# Patient Record
Sex: Male | Born: 1990 | Race: White | Hispanic: No | Marital: Married
Health system: Midwestern US, Community
[De-identification: ages and names within clinical notes are randomized; demographics above are authoritative.]

---
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
## 2015-06-03 ENCOUNTER — Ambulatory Visit (INDEPENDENT_AMBULATORY_CARE_PROVIDER_SITE_OTHER): Payer: Federal, State, Local not specified - PPO | Admitting: Family Medicine

## 2015-06-03 VITALS — BP 120/82 | HR 74 | Temp 98.1°F | Resp 16 | Ht 68.25 in | Wt 214.0 lb

## 2015-06-03 DIAGNOSIS — L600 Ingrowing nail: Secondary | ICD-10-CM | POA: Diagnosis not present

## 2015-06-03 DIAGNOSIS — L03032 Cellulitis of left toe: Secondary | ICD-10-CM

## 2015-06-03 MED ORDER — CEPHALEXIN 500 MG PO CAPS: 500.0000 mg | ORAL_CAPSULE | Freq: Two times a day (BID) | ORAL | Status: AC

## 2015-06-03 NOTE — Progress Notes (Signed)
Subjective:  This chart was scribed for Tanner Post, MD by Broadus John, Medical Scribe. This patient was seen in Room 4 and the patient's care was started at 8:47 AM.   Patient ID: Tanner Castillo, male    DOB: 28-Jan-1991, 24 y.o.   MRN: [id number]  Chief Complaint  Patient presents with  . Ingrown Toenail    Left great toe/ onset 1.5 months   HPI HPI Comments: Tanner Castillo is a 24 y.o. male who presents to [facility] complaining of an ingrown toenail of the left great toe, onset 1.5 months ago.  Pt notes that the nail was slowly growing to the side. He indicates that about a month ago he received antibiotics for the symptoms, however the symptoms came back after about 2 days of finishing the course of the antibiotics. Pt reports that the area is very sore and has drainage. Pt denies any allergies.    There are no active problems to display for this patient.  History reviewed. No pertinent past medical history. History reviewed. No pertinent past surgical history. No Known Allergies Prior to Admission medications   Not on File   Social History   Social History  . Marital Status: Single    Spouse Name: N/A  . Number of Children: N/A  . Years of Education: N/A   Occupational History  . Not on file.   Social History Main Topics  . Smoking status: Never Smoker   . Smokeless tobacco: Not on file  . Alcohol Use: 1.2 oz/week    2 Standard drinks or equivalent per week  . Drug Use: No  . Sexual Activity: Not on file   Other Topics Concern  . Not on file   Social History Narrative  . No narrative on file    Review of Systems  Skin: Positive for wound.      Objective:   Physical Exam  Constitutional: He is oriented to person, place, and time. He appears well-developed and well-nourished. No distress.  HENT:  Head: Normocephalic and atraumatic.  Eyes: EOM are normal. Pupils are equal, round, and reactive to light.  Neck: Neck supple.    Cardiovascular: Normal rate.   Pulmonary/Chest: Effort normal.  Neurological: He is alert and oriented to person, place, and time. No cranial nerve deficit.  Skin: Skin is warm and dry. There is erythema.  Left great toe there is some redundant tissue with clear-yellow discharge on the media and lateral nail fold, surrounding erythema. Erythema on the proximal nail folds.  NVI distally.    Psychiatric: He has a normal mood and affect. His behavior is normal.  Nursing note and vitals reviewed.   Filed Vitals:   06/03/15 0833  BP: 120/82  Pulse: 74  Temp: 98.1 F (36.7 C)  TempSrc: Oral  Resp: 16  Height: 5' 8.25" (1.734 m)  Weight: 214 lb (97.07 kg)  SpO2: 98%      Assessment & Plan:   Tanner Castillo is a 24 y.o. male Ingrown toenail - Plan: cephALEXin (KEFLEX) 500 MG capsule  Paronychia of great toe of left foot - Plan: cephALEXin (KEFLEX) 500 MG capsule  Bilateral ingrown nail with secondary infection/paronychia. See procedure note. Start Keflex 500 mg twice a day. Wedge excision as per procedure note, wound care and return to clinic precautions, as well as information on AVS.   Meds ordered this encounter  Medications  . cephALEXin (KEFLEX) 500 MG capsule    Sig:  Take 1 capsule (500 mg total) by mouth 2 (two) times daily.    Dispense:  20 capsule    Refill:  0   Patient Instructions  Nail care as discussed with Sarah, start antibiotic twice per day, advil or alleve over the counter if needed.  Return to the clinic or go to the nearest emergency room if any of your symptoms worsen or new symptoms occur.  Infected Ingrown Toenail An infected ingrown toenail occurs when the nail edge grows into the skin and bacteria invade the area. Symptoms include pain, tenderness, swelling, and pus drainage from the edge of the nail. Poorly fitting shoes, minor injuries, and improper cutting of the toenail may also contribute to the problem. You should cut your toenails squarely  instead of rounding the edges. Do not cut them too short. Avoid tight or pointed toe shoes. Sometimes the ingrown portion of the nail must be removed. If your toenail is removed, it can take 3-4 months for it to re-grow. HOME CARE INSTRUCTIONS   Soak your infected toe in warm water for 20-30 minutes, 2 to 3 times a day.  Packing or dressings applied to the area should be changed daily.  Take medicine as directed and finish them.  Reduce activities and keep your foot elevated when able to reduce swelling and discomfort. Do this until the infection gets better.  Wear sandals or go barefoot as much as possible while the infected area is sensitive.  See your caregiver for follow-up care in 2-3 days if the infection is not better. SEEK MEDICAL CARE IF:  Your toe is becoming more red, swollen or painful. MAKE SURE YOU:   Understand these instructions.  Will watch your condition.  Will get help right away if you are not doing well or get worse. Document Released: 10/19/2004 Document Revised: 12/04/2011 Document Reviewed: 09/07/2008 [facility] Patient Information 2015 [place], [place]. This information is not intended to replace advice given to you by your health care provider. Make sure you discuss any questions you have with your health care provider.     I personally performed the services described in this documentation, which was scribed in my presence. The recorded information has been reviewed and considered, and addended by me as needed.

## 2015-06-03 NOTE — Patient Instructions (Addendum)
Nail care as discussed with Maralyn Sago, start antibiotic twice per day, advil or alleve over the counter if needed.  Return to the clinic or go to the nearest emergency room if any of your symptoms worsen or new symptoms occur.  Infected Ingrown Toenail An infected ingrown toenail occurs when the nail edge grows into the skin and bacteria invade the area. Symptoms include pain, tenderness, swelling, and pus drainage from the edge of the nail. Poorly fitting shoes, minor injuries, and improper cutting of the toenail may also contribute to the problem. You should cut your toenails squarely instead of rounding the edges. Do not cut them too short. Avoid tight or pointed toe shoes. Sometimes the ingrown portion of the nail must be removed. If your toenail is removed, it can take 3-4 months for it to re-grow. HOME CARE INSTRUCTIONS   Soak your infected toe in warm water for 20-30 minutes, 2 to 3 times a day.  Packing or dressings applied to the area should be changed daily.  Take medicine as directed and finish them.  Reduce activities and keep your foot elevated when able to reduce swelling and discomfort. Do this until the infection gets better.  Wear sandals or go barefoot as much as possible while the infected area is sensitive.  See your caregiver for follow-up care in 2-3 days if the infection is not better. SEEK MEDICAL CARE IF:  Your toe is becoming more red, swollen or painful. MAKE SURE YOU:   Understand these instructions.  Will watch your condition.  Will get help right away if you are not doing well or get worse. Document Released: 10/19/2004 Document Revised: 12/04/2011 Document Reviewed: 09/07/2008 [facility] Patient Information 2015 [place], [place]. This information is not intended to replace advice given to you by your health care provider. Make sure you discuss any questions you have with your health care provider.

## 2015-06-03 NOTE — Progress Notes (Signed)
Procedure:  Consent obtained. Digital block with marcaine and 2% lidocaine for adequate anesthesia.  Betadine prep.  Medial and lateral wedge excisions performed.  Granulation tissue removed and AgNO3 used for cautery.  Xeroform gauze placed on exposed nail bed.  Dressing placed.  Wound care d/w pt.

## 2015-10-06 ENCOUNTER — Ambulatory Visit (INDEPENDENT_AMBULATORY_CARE_PROVIDER_SITE_OTHER): Payer: Federal, State, Local not specified - PPO | Admitting: Emergency Medicine

## 2015-10-06 VITALS — BP 118/82 | HR 83 | Temp 98.6°F | Resp 16 | Ht 69.0 in | Wt 200.0 lb

## 2015-10-06 DIAGNOSIS — L600 Ingrowing nail: Secondary | ICD-10-CM

## 2015-10-06 DIAGNOSIS — L03012 Cellulitis of left finger: Secondary | ICD-10-CM | POA: Diagnosis not present

## 2015-10-06 MED ORDER — SULFAMETHOXAZOLE-TRIMETHOPRIM 800-160 MG PO TABS: 1.0000 | ORAL_TABLET | Freq: Two times a day (BID) | ORAL | Status: AC

## 2015-10-06 NOTE — Patient Instructions (Signed)
Ingrown Toenail  An ingrown toenail occurs when the corner or sides of your toenail grow into the surrounding skin. The big toe is most commonly affected, but it can happen to any of your toes. If your ingrown toenail is not treated, you will be at risk for infection.  CAUSES  This condition may be caused by:  · Wearing shoes that are too small or tight.  · Injury or trauma, such as stubbing your toe or having your toe stepped on.  · Improper cutting or care of your toenails.  · Being born with (congenital) nail or foot abnormalities, such as having a nail that is too big for your toe.  RISK FACTORS  Risk factors for an ingrown toenail include:  · Age. Your nails tend to thicken as you get older, so ingrown nails are more common in older people.  · Diabetes.  · Cutting your toenails incorrectly.  · Blood circulation problems.  SYMPTOMS  Symptoms may include:  · Pain, soreness, or tenderness.  · Redness.  · Swelling.  · Hardening of the skin surrounding the toe.  Your ingrown toenail may be infected if there is fluid, pus, or drainage.  DIAGNOSIS   An ingrown toenail may be diagnosed by medical history and physical exam. If your toenail is infected, your health care provider may test a sample of the drainage.  TREATMENT  Treatment depends on the severity of your ingrown toenail. Some ingrown toenails may be treated at home. More severe or infected ingrown toenails may require surgery to remove all or part of the nail. Infected ingrown toenails may also be treated with antibiotic medicines.  HOME CARE INSTRUCTIONS  · If you were prescribed an antibiotic medicine, finish all of it even if you start to feel better.  · Soak your foot in warm soapy water for 20 minutes, 3 times per day or as directed by your health care provider.  · Carefully lift the edge of the nail away from the sore skin by wedging a small piece of cotton under the corner of the nail. This may help with the pain.  Be careful not to cause more injury  to the area.  · Wear shoes that fit well. If your ingrown toenail is causing you pain, try wearing sandals, if possible.  · Trim your toenails regularly and carefully. Do not cut them in a curved shape. Cut your toenails straight across. This prevents injury to the skin at the corners of the toenail.  · Keep your feet clean and dry.  · If you are having trouble walking and are given crutches by your health care provider, use them as directed.  · Do not pick at your toenail or try to remove it yourself.  · Take medicines only as directed by your health care provider.  · Keep all follow-up visits as directed by your health care provider. This is important.  SEEK MEDICAL CARE IF:  · Your symptoms do not improve with treatment.  SEEK IMMEDIATE MEDICAL CARE IF:  · You have red streaks that start at your foot and go up your leg.  · You have a fever.  · You have increased redness, swelling, or pain.  · You have fluid, blood, or pus coming from your toenail.     This information is not intended to replace advice given to you by your health care provider. Make sure you discuss any questions you have with your health care provider.     Document Released:   09/08/2000 Document Revised: 01/26/2015 Document Reviewed: 08/05/2014  Elsevier Interactive Patient Education ©2016 [employer] Inc[employer]

## 2015-10-06 NOTE — Progress Notes (Signed)
Subjective:  Patient ID: Tanner Castillo, male    DOB: 04/13/1991  Age: 25 y.o. MRN: [id number]  CC: Nail Problem   HPI Tanner Angelndrew S Larmore presents   With a recurrent ingrown nail. He has swelling and redness around the base of the nail and sides.  It was previously treated surgically by removal of the entire nail plate  History Tanner Castillo has no past medical history on file.   He has no past surgical history on file.   His  family history includes Cancer in his maternal grandmother.  He   reports that he has never smoked. He does not have any smokeless tobacco history on file. He reports that he drinks about 1.2 oz of alcohol per week. He reports that he does not use illicit drugs.  Outpatient Prescriptions Prior to Visit  Medication Sig Dispense Refill  . cephALEXin (KEFLEX) 500 MG capsule Take 1 capsule (500 mg total) by mouth 2 (two) times daily. (Patient not taking: Reported on 10/06/2015) 20 capsule 0   No facility-administered medications prior to visit.    Social History   Social History  . Marital Status: Single    Spouse Name: N/A  . Number of Children: N/A  . Years of Education: N/A   Social History Main Topics  . Smoking status: Never Smoker   . Smokeless tobacco: None  . Alcohol Use: 1.2 oz/week    2 Standard drinks or equivalent per week  . Drug Use: No  . Sexual Activity: Not Asked   Other Topics Concern  . None   Social History Narrative     Review of Systems  Constitutional: Negative for fever, chills and appetite change.  HENT: Negative for congestion, ear pain, postnasal drip, sinus pressure and sore throat.   Eyes: Negative for pain and redness.  Respiratory: Negative for cough, shortness of breath and wheezing.   Cardiovascular: Negative for leg swelling.  Gastrointestinal: Negative for nausea, vomiting, abdominal pain, diarrhea, constipation and blood in stool.  Endocrine: Negative for polyuria.  Genitourinary: Negative for dysuria, urgency,  frequency and flank pain.  Musculoskeletal: Negative for gait problem.  Skin: Negative for rash.  Neurological: Negative for weakness and headaches.  Psychiatric/Behavioral: Negative for confusion and decreased concentration. The patient is not nervous/anxious.     Objective:  BP 118/82 mmHg  Pulse 83  Temp(Src) 98.6 F (37 C)  Resp 16  Ht 5\' 9"  (1.753 m)  Wt 200 lb (90.719 kg)  BMI 29.52 kg/m2  SpO2 98%  Physical Exam  Constitutional: He is oriented to person, place, and time. He appears well-developed and well-nourished.  HENT:  Head: Normocephalic and atraumatic.  Eyes: Conjunctivae are normal. Pupils are equal, round, and reactive to light.  Pulmonary/Chest: Effort normal.  Musculoskeletal: He exhibits no edema.  Neurological: He is alert and oriented to person, place, and time.  Skin: Skin is dry.  Psychiatric: He has a normal mood and affect. His behavior is normal. Thought content normal.    he is an ingrown left great toenail in the lateral  nailfold.  He has a paronychia. He has no lymphangitis   Assessment & Plan:   Tanner Castillo was seen today for nail problem.  Diagnoses and all orders for this visit:  Ingrown nail -     Ambulatory referral to Podiatry  Paronychia, left  Other orders -     sulfamethoxazole-trimethoprim (BACTRIM DS,SEPTRA DS) 800-160 MG tablet; Take 1 tablet by mouth 2 (two) times daily.   I am  having Mr. Mierzwa start on sulfamethoxazole-trimethoprim. I am also having him maintain his cephALEXin.  Meds ordered this encounter  Medications  . sulfamethoxazole-trimethoprim (BACTRIM DS,SEPTRA DS) 800-160 MG tablet    Sig: Take 1 tablet by mouth 2 (two) times daily.    Dispense:  20 tablet    Refill:  0    Appropriate red flag conditions were discussed with the patient as well as actions that should be taken.  Patient expressed his understanding.  Follow-up: Return if symptoms worsen or fail to improve.  Carmelina Dane, MD

## 2018-06-08 ENCOUNTER — Emergency Department: Admit: 2018-06-08 | Payer: TRICARE (CHAMPUS)

## 2018-06-08 ENCOUNTER — Inpatient Hospital Stay
Admit: 2018-06-08 | Discharge: 2018-06-08 | Disposition: A | Discharge status: Home / Self Care | Payer: TRICARE (CHAMPUS) | Attending: Emergency Medicine

## 2018-06-08 DIAGNOSIS — J020 Streptococcal pharyngitis: Secondary | ICD-10-CM

## 2018-06-08 LAB — CBC WITH AUTOMATED DIFF
ABS. BASOPHILS: 0 10*3/uL (ref 0.0–0.1)
ABS. EOSINOPHILS: 0 10*3/uL (ref 0.0–0.4)
ABS. LYMPHOCYTES: 0.7 10*3/uL — ABNORMAL LOW (ref 0.9–3.6)
ABS. MONOCYTES: 1.1 10*3/uL (ref 0.05–1.2)
ABS. NEUTROPHILS: 8.5 10*3/uL — ABNORMAL HIGH (ref 1.8–8.0)
BASOPHILS: 0 % (ref 0–2)
EOSINOPHILS: 0 % (ref 0–5)
HCT: 42.7 % (ref 36.0–48.0)
HGB: 14.8 g/dL (ref 13.0–16.0)
LYMPHOCYTES: 7 % — ABNORMAL LOW (ref 21–52)
MCH: 30.6 PG (ref 24.0–34.0)
MCHC: 34.7 g/dL (ref 31.0–37.0)
MCV: 88.4 FL (ref 74.0–97.0)
MONOCYTES: 11 % — ABNORMAL HIGH (ref 3–10)
MPV: 11.1 FL (ref 9.2–11.8)
NEUTROPHILS: 82 % — ABNORMAL HIGH (ref 40–73)
PLATELET: 235 10*3/uL (ref 135–420)
RBC: 4.83 M/uL (ref 4.70–5.50)
RDW: 12.4 % (ref 11.6–14.5)
WBC: 10.4 10*3/uL (ref 4.6–13.2)

## 2018-06-08 LAB — URINE MICROSCOPIC ONLY
WBC, UA: 11 /hpf (ref 0–4)
WBC: 11 /hpf (ref 0–4)

## 2018-06-08 LAB — URINALYSIS W/ RFLX MICROSCOPIC
Bilirubin, Urine: NEGATIVE
Bilirubin: NEGATIVE
Blood, Urine: NEGATIVE
Blood: NEGATIVE
Glucose, Ur: NEGATIVE mg/dL
Glucose: NEGATIVE mg/dL
Ketone: 80 mg/dL — AB
Ketones, Urine: 80 mg/dL — AB
Nitrite, Urine: NEGATIVE
Nitrites: NEGATIVE
Protein, UA: 30 mg/dL — AB
Protein: 30 mg/dL — AB
Specific Gravity, UA: 1.029 (ref 1.005–1.030)
Specific gravity: 1.029 (ref 1.005–1.030)
Urobilinogen, UA, POCT: 0.2 EU/dL (ref 0.2–1.0)
Urobilinogen: 0.2 EU/dL (ref 0.2–1.0)
pH (UA): 5.5 (ref 5.0–8.0)
pH, UA: 5.5 (ref 5.0–8.0)

## 2018-06-08 LAB — METABOLIC PANEL, COMPREHENSIVE
A-G Ratio: 1 (ref 0.8–1.7)
ALT (SGPT): 29 U/L (ref 16–61)
AST (SGOT): 25 U/L (ref 10–38)
Albumin: 4.1 g/dL (ref 3.4–5.0)
Alk. phosphatase: 54 U/L (ref 45–117)
Anion gap: 10 mmol/L (ref 3.0–18)
BUN/Creatinine ratio: 12 (ref 12–20)
BUN: 18 MG/DL (ref 7.0–18)
Bilirubin, total: 1.1 MG/DL — ABNORMAL HIGH (ref 0.2–1.0)
CO2: 25 mmol/L (ref 21–32)
Calcium: 9.7 MG/DL (ref 8.5–10.1)
Chloride: 99 mmol/L — ABNORMAL LOW (ref 100–111)
Creatinine: 1.47 MG/DL — ABNORMAL HIGH (ref 0.6–1.3)
GFR est AA: >60 mL/min/{1.73_m2} (ref >60)
GFR est non-AA: 58 mL/min/{1.73_m2} — ABNORMAL LOW (ref >60)
Globulin: 4.3 g/dL — ABNORMAL HIGH (ref 2.0–4.0)
Glucose: 89 mg/dL (ref 74–99)
Potassium: 3.6 mmol/L (ref 3.5–5.5)
Protein, total: 8.4 g/dL — ABNORMAL HIGH (ref 6.4–8.2)
Sodium: 134 mmol/L — ABNORMAL LOW (ref 136–145)

## 2018-06-08 LAB — LIPASE
Lipase: 166 U/L (ref 73–393)
Lipase: 166 U/L (ref 73–393)

## 2018-06-08 LAB — COMPREHENSIVE METABOLIC PANEL
ALT: 29 U/L (ref 16–61)
AST: 25 U/L (ref 10–38)
Albumin/Globulin Ratio: 1 (ref 0.8–1.7)
Albumin: 4.1 g/dL (ref 3.4–5.0)
Alkaline Phosphatase: 54 U/L (ref 45–117)
Anion Gap: 10 mmol/L (ref 3.0–18)
BUN: 18 MG/DL (ref 7.0–18)
Bun/Cre Ratio: 12 (ref 12–20)
CO2: 25 mmol/L (ref 21–32)
Calcium: 9.7 MG/DL (ref 8.5–10.1)
Chloride: 99 mmol/L — ABNORMAL LOW (ref 100–111)
Creatinine: 1.47 MG/DL — ABNORMAL HIGH (ref 0.6–1.3)
EGFR IF NonAfrican American: 58 mL/min/{1.73_m2} — ABNORMAL LOW (ref >60)
GFR African American: >60 mL/min/{1.73_m2} (ref >60)
Globulin: 4.3 g/dL — ABNORMAL HIGH (ref 2.0–4.0)
Glucose: 89 mg/dL (ref 74–99)
Potassium: 3.6 mmol/L (ref 3.5–5.5)
Sodium: 134 mmol/L — ABNORMAL LOW (ref 136–145)
Total Bilirubin: 1.1 MG/DL — ABNORMAL HIGH (ref 0.2–1.0)
Total Protein: 8.4 g/dL — ABNORMAL HIGH (ref 6.4–8.2)

## 2018-06-08 LAB — CBC WITH AUTO DIFFERENTIAL
Basophils %: 0 % (ref 0–2)
Basophils Absolute: 0 10*3/uL (ref 0.0–0.1)
Eosinophils %: 0 % (ref 0–5)
Eosinophils Absolute: 0 10*3/uL (ref 0.0–0.4)
Hematocrit: 42.7 % (ref 36.0–48.0)
Hemoglobin: 14.8 g/dL (ref 13.0–16.0)
Lymphocytes %: 7 % — ABNORMAL LOW (ref 21–52)
Lymphocytes Absolute: 0.7 10*3/uL — ABNORMAL LOW (ref 0.9–3.6)
MCH: 30.6 PG (ref 24.0–34.0)
MCHC: 34.7 g/dL (ref 31.0–37.0)
MCV: 88.4 FL (ref 74.0–97.0)
MPV: 11.1 FL (ref 9.2–11.8)
Monocytes %: 11 % — ABNORMAL HIGH (ref 3–10)
Monocytes Absolute: 1.1 10*3/uL (ref 0.05–1.2)
Neutrophils %: 82 % — ABNORMAL HIGH (ref 40–73)
Neutrophils Absolute: 8.5 10*3/uL — ABNORMAL HIGH (ref 1.8–8.0)
Platelets: 235 10*3/uL (ref 135–420)
RBC: 4.83 M/uL (ref 4.70–5.50)
RDW: 12.4 % (ref 11.6–14.5)
WBC: 10.4 10*3/uL (ref 4.6–13.2)

## 2018-06-08 MED ORDER — AMOXICILLIN 500 MG TABLET
500 mg | ORAL_TABLET | Freq: Three times a day (TID) | ORAL | 0 refills | Status: AC
Start: 2018-06-08 — End: 2018-06-15

## 2018-06-08 MED ORDER — SUCRALFATE 100 MG/ML ORAL SUSP
100 mg/mL | Freq: Four times a day (QID) | ORAL | 0 refills | Status: AC
Start: 2018-06-08 — End: ?

## 2018-06-08 MED ORDER — ONDANSETRON (PF) 4 MG/2 ML INJECTION
4 mg/2 mL | INTRAMUSCULAR | Status: AC
Start: 2018-06-08 — End: 2018-06-08
  Administered 2018-06-08: 18:00:00 via INTRAVENOUS

## 2018-06-08 MED ORDER — ONDANSETRON 4 MG TAB, RAPID DISSOLVE
4 mg | ORAL_TABLET | ORAL | 0 refills | Status: AC
Start: 2018-06-08 — End: ?

## 2018-06-08 MED ORDER — SODIUM CHLORIDE 0.9% BOLUS IV
0.9 % | Freq: Once | INTRAVENOUS | Status: AC
Start: 2018-06-08 — End: 2018-06-08
  Administered 2018-06-08: 18:00:00 via INTRAVENOUS

## 2018-06-08 MED ORDER — ACETAMINOPHEN 325 MG TABLET
325 mg | ORAL | Status: AC
Start: 2018-06-08 — End: 2018-06-08
  Administered 2018-06-08: 19:00:00 via ORAL

## 2018-06-08 MED ORDER — IOPAMIDOL 61 % IV SOLN
300 mg iodine /mL (61 %) | Freq: Once | INTRAVENOUS | Status: AC
Start: 2018-06-08 — End: 2018-06-08
  Administered 2018-06-08: 20:00:00 via INTRAVENOUS

## 2018-06-08 MED FILL — ISOVUE-300  61 % INTRAVENOUS SOLUTION: 300 mg iodine /mL (61 %) | INTRAVENOUS | Qty: 100

## 2018-06-08 MED FILL — ONDANSETRON (PF) 4 MG/2 ML INJECTION: 4 mg/2 mL | INTRAMUSCULAR | Qty: 2

## 2018-06-08 MED FILL — SODIUM CHLORIDE 0.9 % IV: INTRAVENOUS | Qty: 1000

## 2018-06-08 MED FILL — ACETAMINOPHEN 325 MG TABLET: 325 mg | ORAL | Qty: 2

## 2018-06-08 NOTE — ED Triage Notes (Signed)
Seen last 3 days [place] [employer] [place] [employer]. Sore throat . Positive for strep. Med w/ bicillin 3 days ago / lower right abdominal pain. Vomiting diarrhea. Fever. Taking tylenol and motrin alternating

## 2018-06-08 NOTE — ED Provider Notes (Signed)
Patient presents complaining of fever sore throat and chest discomfort in addition to nausea vomiting and diarrhea.  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  His medical officer told him that if he got worse he should go to the ER which he has done.  He complains of burning discomfort in his chest and also mentioned to the triage nurse that he had pain in his right lower quadrant.           History reviewed. No pertinent past medical history.    History reviewed. No pertinent surgical history.      History reviewed. No pertinent family history.    Social History     Socioeconomic History   ??? Marital status: MARRIED     Spouse name: Not on file   ??? Number of children: Not on file   ??? Years of education: Not on file   ??? Highest education level: Not on file   Occupational History   ??? Not on file   Social Needs   ??? Financial resource strain: Not on file   ??? Food insecurity:     Worry: Not on file     Inability: Not on file   ??? Transportation needs:     Medical: Not on file     Non-medical: Not on file   Tobacco Use   ??? Smoking status: Never Smoker   ??? Smokeless tobacco: Current User   Substance and Sexual Activity   ??? Alcohol use: Not on file   ??? Drug use: Not on file   ??? Sexual activity: Not on file   Lifestyle   ??? Physical activity:     Days per week: Not on file     Minutes per session: Not on file   ??? Stress: Not on file   Relationships   ??? Social connections:     Talks on phone: Not on file     Gets together: Not on file     Attends religious service: Not on file     Active member of club or organization: Not on file     Attends meetings of clubs or organizations: Not on file     Relationship status: Not on file   ??? Intimate partner violence:     Fear of current or ex partner: Not on file     Emotionally abused: Not on file     Physically abused: Not on file     Forced sexual activity: Not on file    Other Topics Concern   ??? Not on file   Social History Narrative   ??? Not on file         ALLERGIES: Patient has no known allergies.    Review of Systems   Constitutional: Positive for chills and fever.   HENT: Positive for congestion.    Eyes: Negative.    Respiratory: Positive for cough.    Gastrointestinal: Positive for abdominal pain, diarrhea and nausea.   All other systems reviewed and are negative.      Vitals:    06/08/18 1233   BP: 119/83   Pulse: 100   Resp: 18   Temp: (!) 102.6 ??F (39.2 ??C)   SpO2: 99%   Weight: 94.3 kg (208 lb)   Height: 5' 8" (1.727 m)            Physical   Exam   Constitutional: He is oriented to person, place, and time. He appears well-developed and well-nourished.  Non-toxic appearance. He does not appear ill. No distress.   HENT:   Head: Normocephalic and atraumatic.   Right Ear: No drainage.   Left Ear: No drainage.   Mouth/Throat: Uvula is midline and mucous membranes are normal. Posterior oropharyngeal erythema present. No oropharyngeal exudate or tonsillar abscesses. No tonsillar exudate.   Eyes: Pupils are equal, round, and reactive to light.   Neck: Normal range of motion. Neck supple.   Cardiovascular: Regular rhythm.   Pulmonary/Chest: Effort normal and breath sounds normal.   Abdominal: Soft. There is tenderness.   Neurological: He is alert and oriented to person, place, and time.   Skin: Skin is warm and dry.   Psychiatric: His behavior is normal.        MDM  Number of Diagnoses or Management Options  Diagnosis management comments: Patient with previously treated strep here for persistent symptoms in addition to several other complaints, none of which have resulted in any acute diagnoses.  Will continue supportive care released to follow-up with his medical officer.       Amount and/or Complexity of Data Reviewed  Clinical lab tests: reviewed           Procedures

## 2018-06-08 NOTE — ED Notes (Cosign Needed)
I performed a brief history of the patient here in triage and I have determined that pt will need further treatment and evaluation from the main side ER physician.  I have placed initial orders based on the history to help in expediting patients care.       Pt treated for strep with bicillin on the ship.  3 days of vomiting, fever, intermittent RLQ pain.  States he did have some tenderness when the [employer] Doctor pressed on his RLQ.     Visit Vitals  BP 119/83 (BP 1 Location: Right arm, BP Patient Position: At rest)   Pulse 100   Temp (!) 102.6 ??F (39.2 ??C)   Resp 18   Ht 5\' 8"  (1.727 m)   Wt 94.3 kg (208 lb)   SpO2 99%   BMI 31.63 kg/m??      Nicolette BangAshlee L Miliyah Luper, PA

## 2018-06-08 NOTE — ED Notes (Signed)
Patient feeling better with decreased pain to throat. Written discharge instructions given to patient with 1 prescription. Discharge home ambulatory with male visitor.

## 2018-06-08 NOTE — ED Notes (Deleted)
I performed a brief history of the patient here in triage and I have determined that pt will need further treatment and evaluation from the main side ER physician.  I have placed initial orders based on the history to help in expediting patients care.       Pt treated for strep with bicillin on the ship.  3 days of vomiting, fever, intermittent RLQ pain.  States he did have some tenderness when the [employer] Doctor pressed on his RLQ.     Visit Vitals  BP 119/83 (BP 1 Location: Right arm, BP Patient Position: At rest)   Pulse 100   Temp (!) 102.6 F (39.2 C)   Resp 18   Ht 5\' 8"  (1.727 m)   Wt 94.3 kg (208 lb)   SpO2 99%   BMI 31.63 kg/m      Nicolette BangAshlee L Emmalynne Courtney, PA

## 2018-06-08 NOTE — ED Notes (Signed)
Seen last 3 days at [employer] [facility]. Sore throat . Positive for strep. Med w/ bicillin 3 days ago / lower right abdominal pain. Vomiting diarrhea. Fever. Taking tylenol and motrin alternating

## 2018-06-08 NOTE — ED Provider Notes (Signed)
Patient presents complaining of fever sore throat and chest discomfort in addition to nausea vomiting and diarrhea.  Patient is [employer] [facility] who did a strep test found him to be positive and treated him with Bicillin.  His medical officer told him that if he got worse he should go to the ER which he has done.  He complains of burning discomfort in his chest and also mentioned to the triage nurse that he had pain in his right lower quadrant.           History reviewed. No pertinent past medical history.    History reviewed. No pertinent surgical history.      History reviewed. No pertinent family history.    Social History     Socioeconomic History   ??? Marital status: MARRIED     Spouse name: Not on file   ??? Number of children: Not on file   ??? Years of education: Not on file   ??? Highest education level: Not on file   Occupational History   ??? Not on file   Social Needs   ??? Financial resource strain: Not on file   ??? Food insecurity:     Worry: Not on file     Inability: Not on file   ??? Transportation needs:     Medical: Not on file     Non-medical: Not on file   Tobacco Use   ??? Smoking status: Never Smoker   ??? Smokeless tobacco: Current User   Substance and Sexual Activity   ??? Alcohol use: Not on file   ??? Drug use: Not on file   ??? Sexual activity: Not on file   Lifestyle   ??? Physical activity:     Days per week: Not on file     Minutes per session: Not on file   ??? Stress: Not on file   Relationships   ??? Social connections:     Talks on phone: Not on file     Gets together: Not on file     Attends religious service: Not on file     Active member of club or organization: Not on file     Attends meetings of clubs or organizations: Not on file     Relationship status: Not on file   ??? Intimate partner violence:     Fear of current or ex partner: Not on file     Emotionally abused: Not on file     Physically abused: Not on file     Forced sexual activity: Not on file    Other Topics Concern   ??? Not on file   Social History Narrative   ??? Not on file         ALLERGIES: Patient has no known allergies.    Review of Systems   Constitutional: Positive for chills and fever.   HENT: Positive for congestion.    Eyes: Negative.    Respiratory: Positive for cough.    Gastrointestinal: Positive for abdominal pain, diarrhea and nausea.   All other systems reviewed and are negative.      Vitals:    06/08/18 1233   BP: 119/83   Pulse: 100   Resp: 18   Temp: (!) 102.6 ??F (39.2 ??C)   SpO2: 99%   Weight: 94.3 kg (208 lb)   Height: 5\' 8"  (1.727 m)            Physical  Exam   Constitutional: He is oriented to person, place, and time. He appears well-developed and well-nourished.  Non-toxic appearance. He does not appear ill. No distress.   HENT:   Head: Normocephalic and atraumatic.   Right Ear: No drainage.   Left Ear: No drainage.   Mouth/Throat: Uvula is midline and mucous membranes are normal. Posterior oropharyngeal erythema present. No oropharyngeal exudate or tonsillar abscesses. No tonsillar exudate.   Eyes: Pupils are equal, round, and reactive to light.   Neck: Normal range of motion. Neck supple.   Cardiovascular: Regular rhythm.   Pulmonary/Chest: Effort normal and breath sounds normal.   Abdominal: Soft. There is tenderness.   Neurological: He is alert and oriented to person, place, and time.   Skin: Skin is warm and dry.   Psychiatric: His behavior is normal.        MDM  Number of Diagnoses or Management Options  Diagnosis management comments: Patient with previously treated strep here for persistent symptoms in addition to several other complaints, none of which have resulted in any acute diagnoses.  Will continue supportive care released to follow-up with his medical officer.       Amount and/or Complexity of Data Reviewed  Clinical lab tests: reviewed           Procedures

## 2018-06-08 NOTE — ED Notes (Signed)
Patient feeling better with decreased pain to throat. Written discharge instructions given to patient with 1 prescription. Discharge home ambulatory with male visitor.

## 2018-06-09 LAB — EKG, 12 LEAD, INITIAL
Atrial Rate: 95 {beats}/min
Calculated P Axis: 69 degrees
Calculated R Axis: 56 degrees
Calculated T Axis: 33 degrees
Diagnosis: NORMAL
P-R Interval: 148 ms
Q-T Interval: 312 ms
QRS Duration: 94 ms
QTC Calculation (Bezet): 392 ms
Ventricular Rate: 95 {beats}/min

## 2018-06-09 LAB — EKG 12-LEAD
Atrial Rate: 95 {beats}/min
Diagnosis: NORMAL
P Axis: 69 degrees
P-R Interval: 148 ms
Q-T Interval: 312 ms
QRS Duration: 94 ms
QTc Calculation (Bazett): 392 ms
R Axis: 56 degrees
T Axis: 33 degrees
Ventricular Rate: 95 {beats}/min

## 2018-06-10 LAB — STREP THROAT SCREEN
SCREEN,SCREEN: NEGATIVE
Strep Screen: NEGATIVE
